# Patient Record
Sex: Male | Born: 1980 | Race: White | Hispanic: No | Marital: Single | Smoking: Current every day smoker
Health system: Southern US, Community
[De-identification: ages and names within clinical notes are randomized; demographics above are authoritative.]

---

## 2017-06-23 ENCOUNTER — Encounter (HOSPITAL_BASED_OUTPATIENT_CLINIC_OR_DEPARTMENT_OTHER): Payer: Self-pay | Admitting: Emergency Medicine

## 2017-06-23 ENCOUNTER — Emergency Department (HOSPITAL_BASED_OUTPATIENT_CLINIC_OR_DEPARTMENT_OTHER): Payer: Self-pay

## 2017-06-23 ENCOUNTER — Emergency Department (HOSPITAL_BASED_OUTPATIENT_CLINIC_OR_DEPARTMENT_OTHER)
Admission: EM | Admit: 2017-06-23 | Discharge: 2017-06-23 | Disposition: A | Discharge status: Home / Self Care | Payer: Self-pay | Attending: Emergency Medicine | Admitting: Emergency Medicine

## 2017-06-23 DIAGNOSIS — X58XXXA Exposure to other specified factors, initial encounter: Secondary | ICD-10-CM | POA: Insufficient documentation

## 2017-06-23 DIAGNOSIS — F17210 Nicotine dependence, cigarettes, uncomplicated: Secondary | ICD-10-CM | POA: Insufficient documentation

## 2017-06-23 DIAGNOSIS — Y999 Unspecified external cause status: Secondary | ICD-10-CM | POA: Insufficient documentation

## 2017-06-23 DIAGNOSIS — Y9389 Activity, other specified: Secondary | ICD-10-CM | POA: Insufficient documentation

## 2017-06-23 DIAGNOSIS — Y929 Unspecified place or not applicable: Secondary | ICD-10-CM | POA: Insufficient documentation

## 2017-06-23 DIAGNOSIS — S62316A Displaced fracture of base of fifth metacarpal bone, right hand, initial encounter for closed fracture: Secondary | ICD-10-CM | POA: Insufficient documentation
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
## 2017-06-23 NOTE — ED Notes (Signed)
Called and given discharge instructions and follow up. Verbalized understanding

## 2017-06-23 NOTE — ED Notes (Signed)
Went to discharge pt in [facility] and there was no one there. Pt apparently left without receiving d/c papers. Called in lobby, but no one was aware of pt leaving.
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
## 2017-06-23 NOTE — Discharge Instructions (Signed)
Please call Dr. Pearletha ForgeHudnall (orthopedic doctor) in the morning to schedule a follow up appointment for your right hand fracture. Information is listed below. X-rays show you have a displaced and comminuted fifth metatarsal fracture.  Please wear the splint we have given you in the ER  You may use ibuprofen for pain management.   Please return to the emergency department if you develop worsening numbness in the hand or have a fever with hand pain. Please also return for any new or worsening symptoms.

## 2018-04-29 ENCOUNTER — Encounter: Payer: Self-pay | Admitting: Urology

## 2018-04-29 ENCOUNTER — Ambulatory Visit: Payer: Self-pay | Admitting: Urology

## 2018-05-07 ENCOUNTER — Encounter: Payer: Self-pay | Admitting: Urology

## 2018-05-07 ENCOUNTER — Other Ambulatory Visit: Payer: Self-pay

## 2018-05-07 ENCOUNTER — Ambulatory Visit (INDEPENDENT_AMBULATORY_CARE_PROVIDER_SITE_OTHER): Payer: PRIVATE HEALTH INSURANCE | Admitting: Urology

## 2018-05-07 VITALS — BP 119/83 | HR 78 | Ht 67.0 in | Wt 167.0 lb

## 2018-05-07 DIAGNOSIS — N433 Hydrocele, unspecified: Secondary | ICD-10-CM | POA: Diagnosis not present
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
